# Patient Record
Sex: Female | Born: 1950 | Race: White | Hispanic: No | Marital: Married | Smoking: Former smoker
Health system: Southern US, Community
[De-identification: ages and names within clinical notes are randomized; demographics above are authoritative.]

## PROBLEM LIST (undated history)

## (undated) DIAGNOSIS — I10 Essential (primary) hypertension: Secondary | ICD-10-CM

## (undated) DIAGNOSIS — G709 Myoneural disorder, unspecified: Secondary | ICD-10-CM

## (undated) DIAGNOSIS — R209 Unspecified disturbances of skin sensation: Principal | ICD-10-CM

## (undated) HISTORY — DX: Myoneural disorder, unspecified: G70.9

## (undated) HISTORY — DX: Unspecified disturbances of skin sensation: R20.9

## (undated) HISTORY — PX: TONSILLECTOMY: SUR1361

## (undated) HISTORY — DX: Essential (primary) hypertension: I10

---

## 2007-05-10 HISTORY — PX: CATARACT EXTRACTION: SUR2

## 2012-07-26 ENCOUNTER — Ambulatory Visit (INDEPENDENT_AMBULATORY_CARE_PROVIDER_SITE_OTHER): Payer: BC Managed Care – PPO | Admitting: Family Medicine

## 2012-07-26 ENCOUNTER — Encounter: Payer: Self-pay | Admitting: Family Medicine

## 2012-07-26 VITALS — BP 130/86 | HR 96 | Temp 98.0°F | Resp 16 | Wt 191.0 lb

## 2012-07-26 DIAGNOSIS — M545 Low back pain: Secondary | ICD-10-CM

## 2012-07-26 DIAGNOSIS — I10 Essential (primary) hypertension: Secondary | ICD-10-CM | POA: Insufficient documentation

## 2012-07-26 LAB — URINALYSIS, MICROSCOPIC ONLY: Crystals: NONE SEEN

## 2012-07-26 LAB — URINALYSIS, ROUTINE W REFLEX MICROSCOPIC
Glucose, UA: NEGATIVE mg/dL
Ketones, ur: 15 mg/dL — AB
pH: 6.5 (ref 5.0–8.0)

## 2012-07-26 MED ORDER — CYCLOBENZAPRINE HCL 10 MG PO TABS: 10.0000 mg | ORAL_TABLET | Freq: Three times a day (TID) | ORAL | Status: DC | PRN

## 2012-07-26 NOTE — Addendum Note (Signed)
Addended by: Lynnea Ferrier T on: 07/26/2012 11:00 AM   Modules accepted: Orders

## 2012-07-26 NOTE — Progress Notes (Signed)
Subjective:     Patient ID: Brenda Mahoney, female   DOB: 1950-08-17, 62 y.o.   MRN: [id number]  HPI Patient reports onset of right-sided back pain beginning Saturday.  Was up in her ribs in the costovertebral area.  Has since spread from that area down her entire right sided back to her buttock area.  The pain comes and goes and spells.  He feels more like a muscle per her report.  She denies any dysesthesias or paresthesias radiating into her legs.  Denies any cough shortness of breath or pleurisy.  She denies any fever.  She denies any hematuria or radiation of the pain to her pelvic area.  Review of Systems  Constitutional: Negative.   HENT: Negative.   Eyes: Negative.   Respiratory: Negative.   Cardiovascular: Negative.   Gastrointestinal: Negative.   Genitourinary: Negative.   Musculoskeletal: Positive for back pain. Negative for arthralgias.       Objective:   Physical Exam  Constitutional: She appears well-developed and well-nourished.  HENT:  Head: Normocephalic and atraumatic.  Neck: Normal range of motion. Neck supple.  Cardiovascular: Normal rate, regular rhythm and normal heart sounds.   Pulmonary/Chest: Effort normal and breath sounds normal. No respiratory distress. She has no wheezes. She has no rales. She exhibits no tenderness.  Abdominal: Soft. Bowel sounds are normal. She exhibits no distension. There is no tenderness. There is no rebound and no guarding.  Musculoskeletal:       Thoracic back: She exhibits tenderness and pain. She exhibits normal range of motion, no bony tenderness, no edema, no deformity and no spasm.       Back:  Neurological: She has normal reflexes. She exhibits normal muscle tone. Coordination normal.   pain is an area diagram.  Negative straight leg raise bilaterally     Assessment:     Low back pain Hypertension well controlled    Plan:     Likely a muscle pull.  I will check a urinalysis to rule out hematuria which would suggest a  kidney stone.  If urinalysis is normal I will proceed with NSAIDs, muscle relaxers, and rest.  Recommended heat be applied to the area.

## 2012-07-30 MED ORDER — CIPROFLOXACIN HCL 500 MG PO TABS: 500.0000 mg | ORAL_TABLET | Freq: Two times a day (BID) | ORAL | Status: DC

## 2012-07-30 NOTE — Addendum Note (Signed)
Addended by: Lynnea Ferrier T on: 07/30/2012 07:04 AM   Modules accepted: Orders

## 2012-07-31 ENCOUNTER — Encounter: Payer: Self-pay | Admitting: Family Medicine

## 2012-07-31 LAB — URINE CULTURE

## 2012-07-31 NOTE — Progress Notes (Signed)
Pt aware.

## 2012-08-02 ENCOUNTER — Ambulatory Visit (INDEPENDENT_AMBULATORY_CARE_PROVIDER_SITE_OTHER): Payer: BC Managed Care – PPO | Admitting: Family Medicine

## 2012-08-02 ENCOUNTER — Encounter: Payer: Self-pay | Admitting: Family Medicine

## 2012-08-02 VITALS — BP 144/94 | HR 78 | Temp 98.6°F | Resp 16

## 2012-08-02 DIAGNOSIS — G629 Polyneuropathy, unspecified: Secondary | ICD-10-CM

## 2012-08-02 DIAGNOSIS — G609 Hereditary and idiopathic neuropathy, unspecified: Secondary | ICD-10-CM

## 2012-08-02 LAB — BASIC METABOLIC PANEL
BUN: 18 mg/dL (ref 6–23)
Calcium: 9.9 mg/dL (ref 8.4–10.5)
Glucose, Bld: 90 mg/dL (ref 70–99)

## 2012-08-02 LAB — VITAMIN B12: Vitamin B-12: 437 pg/mL (ref 211–911)

## 2012-08-02 LAB — HEPATIC FUNCTION PANEL
Albumin: 4.6 g/dL (ref 3.5–5.2)
Total Bilirubin: 0.7 mg/dL (ref 0.3–1.2)
Total Protein: 7.1 g/dL (ref 6.0–8.3)

## 2012-08-02 NOTE — Progress Notes (Signed)
Subjective:     Patient ID: Brenda Mahoney, female   DOB: Dec 14, 1950, 62 y.o.   MRN: [id number]  Back Pain   Patient presents with 5 days of numbness and tingling in the tip of her tongue, the tips of her fingers, and the tips of toes.  Otherwise she is doing well. She denies any muscle weakness, paralysis, unilateral neurologic deficits, fevers, headaches, altered mental status.  She denies any vision changes, diplopia, or difficulty swallowing.  She was recently treated for low back pain UTI with Flexeril and Cipro. The symptoms have totally subsided. Ironically the numbness and paresthesias did not begin until she began Flexeril.  I recently checked a TSH, CMP, fasting lipid panel at her physical. Past Medical History  Diagnosis Date  . Hypertension    Current Outpatient Prescriptions on File Prior to Visit  Medication Sig Dispense Refill  . aspirin 81 MG tablet Take 81 mg by mouth daily.      . ciprofloxacin (CIPRO) 500 MG tablet Take 1 tablet (500 mg total) by mouth 2 (two) times daily.  14 tablet  0  . cyclobenzaprine (FLEXERIL) 10 MG tablet Take 1 tablet (10 mg total) by mouth 3 (three) times daily as needed for muscle spasms.  30 tablet  0  . hydrochlorothiazide (HYDRODIURIL) 25 MG tablet Take 25 mg by mouth daily.      . phentermine (ADIPEX-P) 37.5 MG tablet Take 37.5 mg by mouth daily before breakfast.       No current facility-administered medications on file prior to visit.     Review of Systems  Constitutional: Negative.   HENT: Negative.   Eyes: Negative.   Respiratory: Negative.   Cardiovascular: Negative.   Gastrointestinal: Negative.   Endocrine: Negative.   Genitourinary: Negative.   Musculoskeletal: Positive for back pain.       Objective:   Physical Exam  Constitutional: She is oriented to person, place, and time. She appears well-developed and well-nourished. No distress.  HENT:  Head: Normocephalic and atraumatic.  Right Ear: External ear normal.  Left  Ear: External ear normal.  Mouth/Throat: Oropharynx is clear and moist.  Eyes: Conjunctivae are normal. Pupils are equal, round, and reactive to light.  Neck: Normal range of motion. Neck supple. No JVD present. No thyromegaly present.  Cardiovascular: Normal rate, regular rhythm and normal heart sounds.   No murmur heard. Pulmonary/Chest: Effort normal and breath sounds normal.  Abdominal: Soft. Bowel sounds are normal.  Musculoskeletal: Normal range of motion. She exhibits no edema and no tenderness.  Lymphadenopathy:    She has no cervical adenopathy.  Neurological: She is alert and oriented to person, place, and time. She has normal reflexes. No cranial nerve deficit. She exhibits normal muscle tone. Coordination normal.  Skin: Skin is warm and dry. No rash noted. She is not diaphoretic.       Assessment:     Peripheral neuropathy - Plan: Basic Metabolic Panel, Hepatic Function Panel, Vitamin B12      Plan:     Above laboratory studies showed no electrolyte disturbance or low B12, I will ask her to discontinue all medications for one week. If the paresthesias and numbness persist, further workup with nerve conduction studies/EMG.

## 2012-08-08 ENCOUNTER — Telehealth: Payer: Self-pay | Admitting: Family Medicine

## 2012-08-08 DIAGNOSIS — B3749 Other urogenital candidiasis: Secondary | ICD-10-CM

## 2012-08-08 NOTE — Telephone Encounter (Signed)
Due to limited appt space pt would like to drop off urine on Monday to see if infection has cleared (I told her that would be fine) but tingling has not resolved as of today. If tingling does not resolve by Monday pt will schedule and appt.

## 2012-08-08 NOTE — Telephone Encounter (Signed)
If back pain has resolved, she does not need to come back in.  Has her tingling resolved?  If so go back on BP meds

## 2012-08-09 NOTE — Telephone Encounter (Signed)
Ok, drop off urine Monday, resume bp pills on Monday as it does not seem to be the cause of the tingling.

## 2012-08-09 NOTE — Telephone Encounter (Signed)
Order in for ua and will inform pt on Monday to restart BP meds

## 2012-08-09 NOTE — Telephone Encounter (Signed)
Ok, 

## 2012-08-13 ENCOUNTER — Other Ambulatory Visit (INDEPENDENT_AMBULATORY_CARE_PROVIDER_SITE_OTHER): Payer: BC Managed Care – PPO

## 2012-08-13 DIAGNOSIS — B3749 Other urogenital candidiasis: Secondary | ICD-10-CM

## 2012-08-13 DIAGNOSIS — N390 Urinary tract infection, site not specified: Secondary | ICD-10-CM

## 2012-08-13 LAB — URINALYSIS, ROUTINE W REFLEX MICROSCOPIC
Nitrite: NEGATIVE
Protein, ur: NEGATIVE mg/dL

## 2012-08-13 LAB — URINALYSIS, MICROSCOPIC ONLY

## 2012-08-14 ENCOUNTER — Telehealth: Payer: Self-pay | Admitting: Family Medicine

## 2012-08-14 ENCOUNTER — Ambulatory Visit (INDEPENDENT_AMBULATORY_CARE_PROVIDER_SITE_OTHER): Payer: BC Managed Care – PPO | Admitting: Family Medicine

## 2012-08-14 ENCOUNTER — Encounter: Payer: Self-pay | Admitting: Family Medicine

## 2012-08-14 ENCOUNTER — Other Ambulatory Visit: Payer: Self-pay | Admitting: Family Medicine

## 2012-08-14 VITALS — BP 152/110 | HR 96 | Temp 98.3°F | Resp 18 | Ht 65.0 in | Wt 179.0 lb

## 2012-08-14 DIAGNOSIS — G609 Hereditary and idiopathic neuropathy, unspecified: Secondary | ICD-10-CM

## 2012-08-14 DIAGNOSIS — G629 Polyneuropathy, unspecified: Secondary | ICD-10-CM

## 2012-08-14 NOTE — Progress Notes (Signed)
Subjective:     Patient ID: Brenda Mahoney, female   DOB: Mar 21, 1951, 62 y.o.   MRN: [id number]  HPI  Patient continues to complain of paresthesias and numbness in the tips of her fingers and toes. She denies any dyskinesias. She denies any other focal neurologic deficits. Workup to date has included a B12 level of 437, normal fasting blood sugar of 92, and a normal TSH. She has no evidence of uremia with normal kidney studies.  She has no history of chemotherapy exposure or family history of vasculitis. Her cancer screening is up to date with a normal colonoscopy in 2010 and a mammogram that was done in January and normal.  Remainder of her review of systems is negative.  She is very scared and they as evidenced by her elevated blood pressure due to worries over what this can be. She is afraid of multiple sclerosis.  Past Medical History  Diagnosis Date  . Hypertension    Current Outpatient Prescriptions on File Prior to Visit  Medication Sig Dispense Refill  . aspirin 81 MG tablet Take 81 mg by mouth daily.      . hydrochlorothiazide (HYDRODIURIL) 25 MG tablet Take 25 mg by mouth daily.      . ciprofloxacin (CIPRO) 500 MG tablet Take 1 tablet (500 mg total) by mouth 2 (two) times daily.  14 tablet  0  . cyclobenzaprine (FLEXERIL) 10 MG tablet Take 1 tablet (10 mg total) by mouth 3 (three) times daily as needed for muscle spasms.  30 tablet  0  . phentermine (ADIPEX-P) 37.5 MG tablet Take 37.5 mg by mouth daily before breakfast.       No current facility-administered medications on file prior to visit.   History   Social History  . Marital Status: Married    Spouse Name: N/A    Number of Children: N/A  . Years of Education: N/A   Occupational History  . Not on file.   Social History Main Topics  . Smoking status: Former Smoker    Types: Cigarettes  . Smokeless tobacco: Not on file  . Alcohol Use: Not on file  . Drug Use: Not on file  . Sexually Active: Not on file   Other Topics  Concern  . Not on file   Social History Narrative  . No narrative on file     Review of Systems    negative except that dictated in the history of present illness Objective:   Physical Exam  Constitutional: She is oriented to person, place, and time. She appears well-developed and well-nourished.  Neck: Normal range of motion. No thyromegaly present.  Cardiovascular: Normal rate, regular rhythm and normal heart sounds.   Pulmonary/Chest: Effort normal and breath sounds normal. No respiratory distress. She has no wheezes. She has no rales. She exhibits no tenderness.  Abdominal: Soft. Bowel sounds are normal. She exhibits no distension and no mass. There is no tenderness. There is no rebound and no guarding.  Musculoskeletal: Normal range of motion.  Neurological: She is alert and oriented to person, place, and time. She has normal reflexes. She displays normal reflexes. No cranial nerve deficit. She exhibits normal muscle tone. Coordination normal.       Assessment:     1. Unspecified hereditary and idiopathic peripheral neuropathy - Protein electrophoresis, serum; Future - Protein Electrophoresis, Urine Rflx.; Future - Lyme disease dna by pcr(borrelia burg) - ANCA Screen Reflex Titer - ANA - Sedimentation Rate  2. Axonal polyneuropathy - Protein electrophoresis,  serum; Future - Protein Electrophoresis, Urine Rflx.; Future - Lyme disease dna by pcr(borrelia burg) - ANCA Screen Reflex Titer - ANA - Sedimentation Rate  Plan:     Will workup for paraneoplastic syndromes as well as amyloidosis by obtaining an SPEP and UPEP with reflex immunofixation.  We'll rule out vasculitis is by obtaining a sedimentation rate, ANA, ANCA.  Given the fact she recently moved from [place] we will also check Lyme titers.  She is not concerned about possible exposure to syphilis or HIV as she is in a stable long-term monogamous relationship.  Pending the results of these lab studies we'll then  consult neurology for nerve conduction studies/EMG.

## 2012-08-14 NOTE — Telephone Encounter (Signed)
Has been off meds for one week still has tingling in feet.  Strange sensation in fingertips.  Very concerned.  Finished antibiotic also.  Had tingling in both feet and fingers.  Now just in feet with "strange" sensation in fingers??  Please advise.  No open appts for several weeks.  Only taking BP meds.

## 2012-08-14 NOTE — Telephone Encounter (Signed)
Put her in today at 4:15

## 2012-08-15 LAB — ANTI-NUCLEAR AB-TITER (ANA TITER): ANA Titer 1: NEGATIVE

## 2012-08-15 LAB — URINE CULTURE: Colony Count: 25000

## 2012-08-15 LAB — ANA: Anti Nuclear Antibody(ANA): POSITIVE — AB

## 2012-08-16 ENCOUNTER — Other Ambulatory Visit (INDEPENDENT_AMBULATORY_CARE_PROVIDER_SITE_OTHER): Payer: BC Managed Care – PPO

## 2012-08-16 DIAGNOSIS — G609 Hereditary and idiopathic neuropathy, unspecified: Secondary | ICD-10-CM

## 2012-08-16 DIAGNOSIS — G629 Polyneuropathy, unspecified: Secondary | ICD-10-CM

## 2012-08-16 DIAGNOSIS — Z0000 Encounter for general adult medical examination without abnormal findings: Secondary | ICD-10-CM

## 2012-08-16 LAB — LYME DISEASE DNA BY PCR(BORRELIA BURG): B burgdorferi DNA: NOT DETECTED

## 2012-08-17 LAB — PROTEIN ELECTROPHORESIS, URINE REFLEX: Total Protein, Urine: <3 mg/dL

## 2012-08-20 ENCOUNTER — Other Ambulatory Visit: Payer: Self-pay | Admitting: Family Medicine

## 2012-08-20 DIAGNOSIS — G609 Hereditary and idiopathic neuropathy, unspecified: Secondary | ICD-10-CM

## 2012-08-20 LAB — PROTEIN ELECTROPHORESIS, SERUM
Beta 2: 3.1 % — ABNORMAL LOW (ref 3.2–6.5)
Gamma Globulin: 13.6 % (ref 11.1–18.8)

## 2012-08-20 NOTE — Progress Notes (Signed)
Pt aware of results and will proceed with neuro

## 2012-08-31 ENCOUNTER — Ambulatory Visit: Payer: BC Managed Care – PPO | Admitting: Diagnostic Neuroimaging

## 2012-09-03 ENCOUNTER — Encounter: Payer: Self-pay | Admitting: Neurology

## 2012-09-03 ENCOUNTER — Ambulatory Visit (INDEPENDENT_AMBULATORY_CARE_PROVIDER_SITE_OTHER): Payer: BC Managed Care – PPO | Admitting: Neurology

## 2012-09-03 VITALS — BP 144/86 | HR 81 | Ht 66.5 in | Wt 178.0 lb

## 2012-09-03 DIAGNOSIS — R209 Unspecified disturbances of skin sensation: Secondary | ICD-10-CM

## 2012-09-03 HISTORY — DX: Unspecified disturbances of skin sensation: R20.9

## 2012-09-03 NOTE — Progress Notes (Signed)
Reason for visit: Numbness  Brenda Mahoney is a 62 y.o. female  History of present illness:  Brenda Mahoney is a 62 year old right-handed white female with a history of onset of sensory alterations that began around 07/26/2012. The patient indicates that she had onset of tingling and numb sensations that involved the distal foot, and the fingers and the tongue all at the same time. The patient had a relatively rapid onset of symptoms, and since that time, the symptoms have actually improved gradually. The patient reported no weakness, or gait instability. The patient had no speech alteration or problems with swallowing. The patient denies problems controlling the bowels or the bladder. The patient indicates that she had some mid back pain prior to onset of her sensory symptoms, but she denies any other flu like or viral symptoms. The patient was treated for a urinary tract infection with Cipro. The patient has come off of the Cipro and the Flexeril as she was concerned that these medications were causing her symptoms. The patient had an extensive blood work evaluation that revealed a negative Lyme antibody panel, and a positive ANA. B12 levels were normal. The patient denies any low back pain or neck pain, or pain radiating down the arms or legs. The patient is sent to this office for an evaluation.  Past Medical History  Diagnosis Date  . Hypertension   . Disturbance of skin sensation 09/03/2012    Past Surgical History  Procedure Laterality Date  . Cataract extraction Bilateral 2009  . Tonsillectomy      Family History  Problem Relation Age of Onset  . Hypertension Father   . Alzheimer's disease Paternal Grandmother     Social history:  reports that she has quit smoking. Her smoking use included Cigarettes. She smoked 0.00 packs per day. She does not have any smokeless tobacco history on file. She reports that  drinks alcohol. She reports that she does not use illicit drugs.  Medications:   Current Outpatient Prescriptions on File Prior to Visit  Medication Sig Dispense Refill  . aspirin 81 MG tablet Take 81 mg by mouth daily.      . hydrochlorothiazide (HYDRODIURIL) 25 MG tablet Take 25 mg by mouth daily.      . cyclobenzaprine (FLEXERIL) 10 MG tablet Take 1 tablet (10 mg total) by mouth 3 (three) times daily as needed for muscle spasms.  30 tablet  0  . phentermine (ADIPEX-P) 37.5 MG tablet Take 37.5 mg by mouth daily before breakfast.       No current facility-administered medications on file prior to visit.    Allergies:  Allergies  Allergen Reactions  . Penicillins Hives    ROS:  Out of a complete 14 system review of symptoms, the patient complains only of the following symptoms, and all other reviewed systems are negative.  Numbness  Blood pressure 144/86, pulse 81, height 5' 6.5" (1.689 m), weight 178 lb (80.74 kg).  Physical Exam  General: The patient is alert and cooperative at the time of the examination.  Head: Pupils are equal, round, and reactive to light. Discs are flat bilaterally.  Neck: The neck is supple, no carotid bruits are noted.  Respiratory: The respiratory examination is clear.  Cardiovascular: The cardiovascular examination reveals a regular rate and rhythm, no obvious murmurs or rubs are noted.  Neuromuscular: Range of movement of the cervical spine is full and normal.  Skin: Extremities are without significant edema.  Neurologic Exam  Mental status:  Cranial nerves:  Facial symmetry is present. There is good sensation of the face to pinprick and soft touch bilaterally. The strength of the facial muscles and the muscles to head turning and shoulder shrug are normal bilaterally. Speech is well enunciated, no aphasia or dysarthria is noted. Extraocular movements are full. Visual fields are full.  Motor: The motor testing reveals 5 over 5 strength of all 4 extremities. Good symmetric motor tone is noted throughout.  Sensory:  Sensory testing is intact to pinprick, soft touch, vibration sensation, and position sense on all 4 extremities. No evidence of extinction is noted.  Coordination: Cerebellar testing reveals good finger-nose-finger and heel-to-shin bilaterally.  Gait and station: Gait is normal. Tandem gait is normal. Romberg is negative. No drift is seen.  Reflexes: Deep tendon reflexes are symmetric and normal bilaterally, with the exception that the ankle jerk reflexes are depressed. Toes are downgoing bilaterally.   Assessment/Plan:  1. Numbness  The patient reports onset of sensory alterations on all 4 extremities and on the tongue at the same time. This history would be unusual for a neuropathy, but entities such as Guillain-Barre syndrome with mild symptoms need to be considered. The workup however, should include MRI of the brain initially, with further blood work. If the studies are unremarkable, a nerve conduction study may be done. The jerk reflexes are depressed. The patient will followup if needed. I will contact her with the results of the above studies. The patient will need evaluation for a possible demyelinating disease.  Marlan Palau MD 09/03/2012 2:21 PM  [facility] [address] [place], [place] [address]  Phone [phone] Fax [phone]

## 2012-09-04 ENCOUNTER — Ambulatory Visit: Payer: BC Managed Care – PPO | Admitting: Family Medicine

## 2012-09-05 ENCOUNTER — Telehealth: Payer: Self-pay | Admitting: Neurology

## 2012-09-05 LAB — ENA+DNA/DS+SJORGEN'S
ENA RNP Ab: <0.2 AI (ref 0.0–0.9)
ENA SSA (RO) Ab: <0.2 AI (ref 0.0–0.9)
ENA Smith (SM) Ab: <0.2 AI (ref 0.0–0.9)

## 2012-09-05 NOTE — Telephone Encounter (Signed)
I called the patient. The blood work was relatively unremarkable, but ANA was positive. The reflex studies were negative however, and the clinical significance of the positive ANA is doubtful. MRI the brain is pending, I'll contact the patient once this is done.

## 2012-09-06 ENCOUNTER — Ambulatory Visit (INDEPENDENT_AMBULATORY_CARE_PROVIDER_SITE_OTHER): Payer: BC Managed Care – PPO

## 2012-09-06 DIAGNOSIS — R209 Unspecified disturbances of skin sensation: Secondary | ICD-10-CM

## 2012-09-07 ENCOUNTER — Telehealth: Payer: Self-pay | Admitting: Neurology

## 2012-09-07 ENCOUNTER — Encounter: Payer: Self-pay | Admitting: Family Medicine

## 2012-09-07 ENCOUNTER — Ambulatory Visit (INDEPENDENT_AMBULATORY_CARE_PROVIDER_SITE_OTHER): Payer: BC Managed Care – PPO | Admitting: Family Medicine

## 2012-09-07 VITALS — BP 150/98 | HR 74 | Temp 98.4°F | Resp 16 | Wt 180.0 lb

## 2012-09-07 DIAGNOSIS — I10 Essential (primary) hypertension: Secondary | ICD-10-CM

## 2012-09-07 DIAGNOSIS — K649 Unspecified hemorrhoids: Secondary | ICD-10-CM

## 2012-09-07 MED ORDER — HYDROCORTISONE ACETATE 25 MG RE SUPP: 25.0000 mg | Freq: Two times a day (BID) | RECTAL | Status: DC

## 2012-09-07 MED ORDER — LOSARTAN POTASSIUM 50 MG PO TABS: 50.0000 mg | ORAL_TABLET | Freq: Every day | ORAL | Status: DC

## 2012-09-07 MED ORDER — GADOPENTETATE DIMEGLUMINE 469.01 MG/ML IV SOLN: 16.0000 mL | Freq: Once | INTRAVENOUS | Status: AC | PRN

## 2012-09-07 NOTE — Telephone Encounter (Signed)
I called patient. The MRI the brain was unremarkable. I discussed the potential he getting a nerve conduction study. The patient indicates that the tingling in the hands are getting better. Did to the cost reasons, the patient does not wish to undergo the nerve conduction study at this time. She will contact me if she believes that the symptoms are getting worse.

## 2012-09-07 NOTE — Progress Notes (Signed)
  Subjective:    Patient ID: Brenda Mahoney, female    DOB: 1950-07-10, 62 y.o.   MRN: [id number]  HPI Patient presents with 2 concerns. Prominent one is her blood pressure. She is currently taking adequate Dyazide 25 mg by mouth daily. She denies chest pain or shortness of breath. Her blood pressure remains elevated at 150/98  #2 is hemorrhoids-  she reports hemorrhoids which are inflamed around her rectum. She describes some trace bright red blood per rectum with wiping. She describes itching and pain with defecation.   Past Medical History  Diagnosis Date  . Hypertension   . Disturbance of skin sensation 09/03/2012  . Neuromuscular disorder    Current Outpatient Prescriptions on File Prior to Visit  Medication Sig Dispense Refill  . aspirin 81 MG tablet Take 81 mg by mouth daily.      . hydrochlorothiazide (HYDRODIURIL) 25 MG tablet Take 25 mg by mouth daily.       Current Facility-Administered Medications on File Prior to Visit  Medication Dose Route Frequency Provider Last Rate Last Dose  . gadopentetate dimeglumine (MAGNEVIST) injection 16 mL  16 mL Intravenous Once PRN York Spaniel, MD       Allergies  Allergen Reactions  . Penicillins Hives   History   Social History  . Marital Status: Married    Spouse Name: N/A    Number of Children: 1  . Years of Education: MS   Occupational History  .     Social History Main Topics  . Smoking status: Former Smoker    Types: Cigarettes  . Smokeless tobacco: Not on file  . Alcohol Use: Yes     Comment: One glass of wine per day  . Drug Use: No  . Sexually Active: Not on file   Other Topics Concern  . Not on file   Social History Narrative  . No narrative on file     Review of Systems  All other systems reviewed and are negative.       Objective:   Physical Exam  Vitals reviewed. Constitutional: She appears well-developed and well-nourished.  Cardiovascular: Normal rate, regular rhythm, normal heart sounds and  intact distal pulses.   No murmur heard. Pulmonary/Chest: Effort normal and breath sounds normal. No respiratory distress. She has no wheezes. She has no rales.   rectal exam shows a protruding internal hemorrhoid at 3:00 with the patient leaning over the exam table. There is no thrombus. There is no perirectal abscess.          Assessment & Plan:  Essential hypertension, benign  Hemorrhoids  We will add losartan 50 mg by mouth daily for her blood pressure. Recheck blood pressure in one month. Continue hydrochlorothiazide. For her hemorrhoids, I gave her Anusol-HC suppositories 1 per rectum twice a day for 7 days. She is to return immediately if symptoms worsen or change. She is return is symptoms persist. We also discussed possible surgical options for persistent hemorrhoids.

## 2013-03-15 ENCOUNTER — Encounter: Payer: Self-pay | Admitting: Family Medicine

## 2013-03-15 ENCOUNTER — Telehealth: Payer: Self-pay | Admitting: *Deleted

## 2013-03-15 ENCOUNTER — Ambulatory Visit (INDEPENDENT_AMBULATORY_CARE_PROVIDER_SITE_OTHER): Payer: BC Managed Care – PPO | Admitting: Family Medicine

## 2013-03-15 VITALS — BP 130/82 | HR 80 | Temp 98.2°F | Resp 16 | Wt 180.0 lb

## 2013-03-15 DIAGNOSIS — L719 Rosacea, unspecified: Secondary | ICD-10-CM

## 2013-03-15 MED ORDER — METRONIDAZOLE 1 % EX GEL: Freq: Every day | CUTANEOUS | Status: DC

## 2013-03-15 MED ORDER — DOXYCYCLINE HYCLATE 100 MG PO CAPS: 100.0000 mg | ORAL_CAPSULE | Freq: Every day | ORAL | Status: DC

## 2013-03-15 NOTE — Telephone Encounter (Signed)
Can we switch her to doxycycline 100 mg poqday.  Unfortunately it is oral.

## 2013-03-15 NOTE — Telephone Encounter (Signed)
Pt ok with doxycycline and rx sent to pharm

## 2013-03-15 NOTE — Progress Notes (Signed)
  Subjective:    Patient ID: Brenda Mahoney, female    DOB: 1951-04-23, 62 y.o.   MRN: [id number]  HPI  Patient is a very pleasant 62 year old white female who comes in today complaining of rosacea. She has erythematous rash on both cheeks with occasional red papules. It also encompasses her for head. She has dealt with rosacea off and on for years. However today the rash has gradually worsened to the point she would like to consider treatment. Otherwise she is doing well. She does report some mild hair loss in an androgenic pattern. Extending in the center. The sides and back continue remainder conformal. She has no areas of alopecia areata. . She is not under increased stress. Past Medical History  Diagnosis Date  . Hypertension   . Disturbance of skin sensation 09/03/2012  . Neuromuscular disorder    Current Outpatient Prescriptions on File Prior to Visit  Medication Sig Dispense Refill  . aspirin 81 MG tablet Take 81 mg by mouth daily.      . hydrochlorothiazide (HYDRODIURIL) 25 MG tablet Take 25 mg by mouth daily.       No current facility-administered medications on file prior to visit.   Allergies  Allergen Reactions  . Penicillins Hives   History   Social History  . Marital Status: Married    Spouse Name: N/A    Number of Children: 1  . Years of Education: MS   Occupational History  .     Social History Main Topics  . Smoking status: Former Smoker    Types: Cigarettes  . Smokeless tobacco: Not on file  . Alcohol Use: Yes     Comment: One glass of wine per day  . Drug Use: No  . Sexual Activity: Not on file   Other Topics Concern  . Not on file   Social History Narrative  . No narrative on file     Review of Systems  All other systems reviewed and are negative.       Objective:   Physical Exam  Vitals reviewed. Cardiovascular: Normal rate, regular rhythm and normal heart sounds.   Pulmonary/Chest: Effort normal and breath sounds normal.  Skin: Rash  noted. There is erythema.    Patient has generalized erythema in both cheeks with occasional red papules. It is also present on her forehead.      Assessment & Plan:  1. Rosacea Begin MetroGel 1% apply daily. Recheck in 4-5 weeks. His symptoms have not improved consider doxycycline. Androgenic alopecia I recommended Rogaine over-the-counter. If that does not work the patient could consider finasteride off label. - metroNIDAZOLE (METROGEL) 1 % gel; Apply topically daily.  Dispense: 60 g; Refill: 5

## 2013-07-16 ENCOUNTER — Ambulatory Visit (INDEPENDENT_AMBULATORY_CARE_PROVIDER_SITE_OTHER): Payer: BC Managed Care – PPO | Admitting: Family Medicine

## 2013-07-16 ENCOUNTER — Encounter: Payer: Self-pay | Admitting: Family Medicine

## 2013-07-16 ENCOUNTER — Other Ambulatory Visit: Payer: Self-pay | Admitting: Family Medicine

## 2013-07-16 ENCOUNTER — Encounter: Payer: Self-pay | Admitting: *Deleted

## 2013-07-16 VITALS — BP 128/80 | HR 80 | Temp 99.0°F | Resp 16 | Ht 66.5 in | Wt 180.0 lb

## 2013-07-16 DIAGNOSIS — Z1231 Encounter for screening mammogram for malignant neoplasm of breast: Secondary | ICD-10-CM

## 2013-07-16 DIAGNOSIS — I10 Essential (primary) hypertension: Secondary | ICD-10-CM

## 2013-07-16 MED ORDER — HYDROCHLOROTHIAZIDE 25 MG PO TABS: 25.0000 mg | ORAL_TABLET | Freq: Every day | ORAL | Status: DC

## 2013-07-16 NOTE — Progress Notes (Signed)
   Subjective:    Patient ID: Brenda Mahoney, female    DOB: 07-07-1950, 63 y.o.   MRN: [id number]  HPI Patient is a very pleasant 27 white female who comes in today for recheck of her blood pressure. He is currently 128/80. She is taking hydrochlorothiazide 25 mg by mouth daily. She denies any chest pain, shortness of breath, dyspnea on exertion. She denies any cramps or myalgias. Symptoms of peripheral neuropathy that she had last year have improved. Overall she is doing well. I have recommended an annual mammogram. I have also recommended a Pap smear every 3 years. The patient defers these for now. Past Medical History  Diagnosis Date  . Hypertension   . Disturbance of skin sensation 09/03/2012  . Neuromuscular disorder    Current Outpatient Prescriptions on File Prior to Visit  Medication Sig Dispense Refill  . aspirin 81 MG tablet Take 81 mg by mouth daily.       No current facility-administered medications on file prior to visit.   Allergies  Allergen Reactions  . Penicillins Hives   History   Social History  . Marital Status: Married    Spouse Name: N/A    Number of Children: 1  . Years of Education: MS   Occupational History  .     Social History Main Topics  . Smoking status: Former Smoker    Types: Cigarettes  . Smokeless tobacco: Not on file  . Alcohol Use: Yes     Comment: One glass of wine per day  . Drug Use: No  . Sexual Activity: Not on file   Other Topics Concern  . Not on file   Social History Narrative  . No narrative on file      Review of Systems  All other systems reviewed and are negative.       Objective:   Physical Exam  Vitals reviewed. Constitutional: She appears well-developed and well-nourished.  Neck: Neck supple. No thyromegaly present.  Cardiovascular: Normal rate, regular rhythm and normal heart sounds.  Exam reveals no gallop and no friction rub.   No murmur heard. Pulmonary/Chest: Effort normal and breath sounds normal. No  respiratory distress. She has no wheezes. She has no rales.  Abdominal: Soft. Bowel sounds are normal. She exhibits no distension. There is no tenderness. There is no rebound and no guarding.  Musculoskeletal: She exhibits no edema.  Lymphadenopathy:    She has no cervical adenopathy.          Assessment & Plan:  1. HTN (hypertension) Patient's blood pressure is excellent. Continue hydrochlorothiazide 25 mg by mouth daily. Have asked the patient return fasting for CBC CMP and fasting lipid panel. I have also recommended an angle mammogram and a Pap smear every 3 years. The patient defers these for now. She is concerned about a lesion on the posterior aspect of her left shoulder. It is an 8 mm pink fleshy papule with a warty appearance. I believe that it is a seborrheic Keratosis. I reassured the patient. If the lesion changes we can certainly do a shave biopsy - COMPLETE METABOLIC PANEL WITH GFR; Future - Lipid panel; Future - CBC with Differential; Future

## 2013-08-15 ENCOUNTER — Encounter: Payer: Self-pay | Admitting: Family Medicine

## 2013-11-14 ENCOUNTER — Ambulatory Visit (INDEPENDENT_AMBULATORY_CARE_PROVIDER_SITE_OTHER): Payer: BC Managed Care – PPO | Admitting: Family Medicine

## 2013-11-14 ENCOUNTER — Encounter: Payer: Self-pay | Admitting: Family Medicine

## 2013-11-14 VITALS — BP 110/88 | HR 100 | Temp 98.5°F | Resp 16 | Ht 66.5 in | Wt 180.0 lb

## 2013-11-14 DIAGNOSIS — M65311 Trigger thumb, right thumb: Secondary | ICD-10-CM

## 2013-11-14 DIAGNOSIS — M6530 Trigger finger, unspecified finger: Secondary | ICD-10-CM

## 2013-11-14 DIAGNOSIS — E669 Obesity, unspecified: Secondary | ICD-10-CM

## 2013-11-14 MED ORDER — PHENTERMINE HCL 37.5 MG PO CAPS: 37.5000 mg | ORAL_CAPSULE | ORAL | Status: DC

## 2013-11-14 NOTE — Progress Notes (Signed)
   Subjective:    Patient ID: Brenda Mahoney, female    DOB: 01-01-1951, 63 y.o.   MRN: [id number]  HPI  Patient complains of pain in the right thumb with flexion. She also complains of a popping and locking sensation at the base of the first MCP joint.  She is also been exercising vigorously the last 3 months. She is running and walking at least an hour a day. She's been unsuccessful in losing weight. She is interested in using adipex to facilitate weight loss.   Past Medical History  Diagnosis Date  . Hypertension   . Disturbance of skin sensation 09/03/2012  . Neuromuscular disorder    Current Outpatient Prescriptions on File Prior to Visit  Medication Sig Dispense Refill  . aspirin 81 MG tablet Take 81 mg by mouth daily.      . hydrochlorothiazide (HYDRODIURIL) 25 MG tablet Take 1 tablet (25 mg total) by mouth daily.  90 tablet  3   No current facility-administered medications on file prior to visit.   Allergies  Allergen Reactions  . Penicillins Hives   History   Social History  . Marital Status: Married    Spouse Name: N/A    Number of Children: 1  . Years of Education: MS   Occupational History  .     Social History Main Topics  . Smoking status: Former Smoker    Types: Cigarettes  . Smokeless tobacco: Not on file  . Alcohol Use: Yes     Comment: One glass of wine per day  . Drug Use: No  . Sexual Activity: Not on file   Other Topics Concern  . Not on file   Social History Narrative  . No narrative on file       Review of Systems  All other systems reviewed and are negative.      Objective:   Physical Exam  Vitals reviewed. Cardiovascular: Normal rate, regular rhythm and normal heart sounds.   Pulmonary/Chest: Effort normal and breath sounds normal.  Abdominal: Soft. Bowel sounds are normal.  Musculoskeletal:       Right hand: She exhibits normal range of motion, no tenderness, no bony tenderness, no deformity, no laceration and no swelling. Normal  sensation noted. Normal strength noted.          Assessment & Plan:  1. Trigger thumb of right hand Suspect the patient is developing a trigger finger. I have recommended a thumb spica splint and relative immobilization and rest for several weeks. If the problem worsens consider cortisone injection.  2. Obesity, unspecified adipex 37.5 mg poqday.  I gave the patient 30 pills with 2 refills.  Discussed possible side effects of the med.

## 2014-02-07 ENCOUNTER — Telehealth: Payer: Self-pay | Admitting: Family Medicine

## 2014-02-07 DIAGNOSIS — M6530 Trigger finger, unspecified finger: Secondary | ICD-10-CM

## 2014-02-07 NOTE — Telephone Encounter (Signed)
Patient is calling to try to get a referral for her trigger finger. It has not gotten any better  [phone]

## 2014-02-07 NOTE — Telephone Encounter (Signed)
LMTRC

## 2014-02-10 NOTE — Telephone Encounter (Signed)
Referral initiated

## 2014-02-10 NOTE — Telephone Encounter (Signed)
Pt called back and stated that she was here in July and had a problem with her finger trigger and states it has gotten worse, wants to a referral to have somethng done.

## 2014-02-10 NOTE — Telephone Encounter (Signed)
Ok with referral to hand/ortho

## 2014-05-19 ENCOUNTER — Encounter: Payer: Self-pay | Admitting: Family Medicine

## 2014-05-19 ENCOUNTER — Ambulatory Visit (INDEPENDENT_AMBULATORY_CARE_PROVIDER_SITE_OTHER): Payer: 59 | Admitting: Family Medicine

## 2014-05-19 VITALS — BP 130/84 | HR 74 | Temp 98.7°F | Resp 14 | Ht 66.5 in | Wt 168.0 lb

## 2014-05-19 DIAGNOSIS — I10 Essential (primary) hypertension: Secondary | ICD-10-CM

## 2014-05-19 DIAGNOSIS — Z13820 Encounter for screening for osteoporosis: Secondary | ICD-10-CM

## 2014-05-19 DIAGNOSIS — Z1231 Encounter for screening mammogram for malignant neoplasm of breast: Secondary | ICD-10-CM

## 2014-05-19 MED ORDER — HYDROCHLOROTHIAZIDE 25 MG PO TABS: 25.0000 mg | ORAL_TABLET | Freq: Every day | ORAL | Status: DC

## 2014-05-19 NOTE — Progress Notes (Signed)
Subjective:    Patient ID: Brenda Mahoney, female    DOB: 13-Jul-1950, 64 y.o.   MRN: [id number]  HPI Patient is here today for follow-up of her hypertension. She is however upset. Her husband is very disappointed in our clinic because he was unable to be seen acutely 1 day for an upper respiratory infection. This has the patient upset as well. Otherwise she has no complaints. She denies any chest pain shortness of breath or dyspnea on exertion. She continues to take hydrochlorthiazide 25 mg by mouth daily for hypertension. She denies any significant leg swelling. She denies any muscle cramps. She has her labs checked annually at work. This is scheduled in one month. At that time they perform a CBC, CMP, and fasting lipid panel. I have asked the patient to bring me the values that I can review them. She is due for mammogram in March. She is due for a bone density test as well. Colonoscopy was approximately 5 years ago. The remainder of her preventative care is up-to-date. Past Medical History  Diagnosis Date  . Hypertension   . Disturbance of skin sensation 09/03/2012  . Neuromuscular disorder    Past Surgical History  Procedure Laterality Date  . Cataract extraction Bilateral 2009  . Tonsillectomy     Current Outpatient Prescriptions on File Prior to Visit  Medication Sig Dispense Refill  . aspirin 81 MG tablet Take 81 mg by mouth daily.     No current facility-administered medications on file prior to visit.   Allergies  Allergen Reactions  . Penicillins Hives   History   Social History  . Marital Status: Married    Spouse Name: N/A    Number of Children: 1  . Years of Education: MS   Occupational History  .     Social History Main Topics  . Smoking status: Former Smoker    Types: Cigarettes  . Smokeless tobacco: Not on file  . Alcohol Use: Yes     Comment: One glass of wine per day  . Drug Use: No  . Sexual Activity: Not on file   Other Topics Concern  . Not on file     Social History Narrative      Review of Systems  All other systems reviewed and are negative.      Objective:   Physical Exam  Constitutional: She appears well-developed and well-nourished.  Neck: Neck supple. No JVD present. No thyromegaly present.  Cardiovascular: Normal rate, regular rhythm, normal heart sounds and intact distal pulses.   No murmur heard. Pulmonary/Chest: Effort normal and breath sounds normal. No respiratory distress. She has no wheezes. She has no rales.  Abdominal: Soft. Bowel sounds are normal. She exhibits no distension. There is no tenderness. There is no rebound and no guarding.  Musculoskeletal: She exhibits no edema.  Vitals reviewed.         Assessment & Plan:  Benign essential HTN  Screening mammogram for high-risk patient - Plan: MM Digital Screening  Screening for osteoporosis - Plan: DG Bone Density  Patient's blood pressures acceptable. I would like to see her CBC, CMP, and fasting lipid panel in one month. I will schedule the patient for mammogram as well as a bone density test in March. The remainder of her preventative care is up-to-date. I recommended a pneumonia vaccine after 65. I apologized to the patient that her husband was upset for not being able to be seen as a walk-in. I was unaware that this happened.  If the patient would like to change medical providers, I will be glad to facilitate him in any way that I can.

## 2014-05-23 ENCOUNTER — Encounter: Payer: Self-pay | Admitting: *Deleted

## 2014-08-05 ENCOUNTER — Ambulatory Visit (INDEPENDENT_AMBULATORY_CARE_PROVIDER_SITE_OTHER): Payer: 59 | Admitting: Family Medicine

## 2014-08-05 ENCOUNTER — Encounter: Payer: Self-pay | Admitting: Family Medicine

## 2014-08-05 VITALS — BP 146/98 | HR 68 | Temp 97.4°F | Resp 18

## 2014-08-05 DIAGNOSIS — I10 Essential (primary) hypertension: Secondary | ICD-10-CM

## 2014-08-05 DIAGNOSIS — J209 Acute bronchitis, unspecified: Secondary | ICD-10-CM | POA: Diagnosis not present

## 2014-08-05 MED ORDER — LOSARTAN POTASSIUM 50 MG PO TABS: 50.0000 mg | ORAL_TABLET | Freq: Every day | ORAL | Status: DC

## 2014-08-05 MED ORDER — AZITHROMYCIN 250 MG PO TABS: ORAL_TABLET | ORAL | Status: DC

## 2014-08-05 NOTE — Progress Notes (Signed)
   Subjective:    Patient ID: Brenda Mahoney, female    DOB: [birth year], 64 y.o.   MRN: [id number]  HPI  Patient has had a cough that is worsening for the last 3 weeks. The cough is productive of yellow sputum. She reports subjective fevers and chills. She also reports fatigue. She reports some mild shortness of breath and dyspnea on exertion. Past Medical History  Diagnosis Date  . Hypertension   . Disturbance of skin sensation 09/03/2012  . Neuromuscular disorder    Past Surgical History  Procedure Laterality Date  . Cataract extraction Bilateral 2009  . Tonsillectomy     Current Outpatient Prescriptions on File Prior to Visit  Medication Sig Dispense Refill  . aspirin 81 MG tablet Take 81 mg by mouth daily.    Marland Kitchen glucosamine-chondroitin 500-400 MG tablet Take 1 tablet by mouth 3 (three) times daily.    . Omega-3 Fatty Acids (FISH OIL) 1200 MG CAPS Take by mouth.    . hydrochlorothiazide (HYDRODIURIL) 25 MG tablet Take 1 tablet (25 mg total) by mouth daily. (Patient not taking: Reported on 08/05/2014) 90 tablet 3   No current facility-administered medications on file prior to visit.   Allergies  Allergen Reactions  . Penicillins Hives   History   Social History  . Marital Status: Married    Spouse Name: N/A  . Number of Children: 1  . Years of Education: MS   Occupational History  .     Social History Main Topics  . Smoking status: Former Smoker    Types: Cigarettes  . Smokeless tobacco: Not on file  . Alcohol Use: Yes     Comment: One glass of wine per day  . Drug Use: No  . Sexual Activity: Not on file   Other Topics Concern  . Not on file   Social History Narrative     Review of Systems  All other systems reviewed and are negative.      Objective:   Physical Exam  Constitutional: She appears well-developed and well-nourished.  HENT:  Right Ear: External ear normal.  Left Ear: External ear normal.  Nose: Nose normal.  Mouth/Throat: Oropharynx is  clear and moist. No oropharyngeal exudate.  Eyes: Conjunctivae are normal.  Neck: Neck supple.  Cardiovascular: Normal rate, regular rhythm, normal heart sounds and intact distal pulses.   Pulmonary/Chest: Effort normal and breath sounds normal. No respiratory distress. She has no wheezes. She has no rales. She exhibits no tenderness.  Lymphadenopathy:    She has no cervical adenopathy.  Vitals reviewed.         Assessment & Plan:  Benign essential HTN - Plan: losartan (COZAAR) 50 MG tablet  Acute bronchitis, unspecified organism  Patient has bronchitis. Begin a Z-Pak. Continue to use Delsym as needed for cough. Patient wants to discontinue hydrochlorothiazide due to constipation. I will replace the hydrochlorothiazide with losartan 50 mg by mouth daily.

## 2014-09-04 LAB — HM DEXA SCAN: HM Dexa Scan: NORMAL

## 2014-09-04 LAB — HM MAMMOGRAPHY: HM Mammogram: NORMAL

## 2014-10-01 ENCOUNTER — Other Ambulatory Visit: Payer: Self-pay | Admitting: Family Medicine

## 2014-10-01 DIAGNOSIS — I10 Essential (primary) hypertension: Secondary | ICD-10-CM

## 2014-10-01 MED ORDER — LOSARTAN POTASSIUM 50 MG PO TABS: 50.0000 mg | ORAL_TABLET | Freq: Every day | ORAL | Status: DC

## 2014-10-01 NOTE — Telephone Encounter (Signed)
Medication called/sent to requested pharmacy  

## 2014-10-02 ENCOUNTER — Encounter: Payer: Self-pay | Admitting: Family Medicine

## 2014-10-10 ENCOUNTER — Encounter: Payer: Self-pay | Admitting: Family Medicine

## 2014-10-10 ENCOUNTER — Ambulatory Visit (INDEPENDENT_AMBULATORY_CARE_PROVIDER_SITE_OTHER): Payer: 59 | Admitting: Family Medicine

## 2014-10-10 VITALS — BP 130/82 | HR 70 | Temp 98.1°F | Resp 16

## 2014-10-10 DIAGNOSIS — R252 Cramp and spasm: Secondary | ICD-10-CM | POA: Diagnosis not present

## 2014-10-10 NOTE — Progress Notes (Signed)
   Subjective:    Patient ID: Brenda Mahoney, female    DOB: [birth year], 64 y.o.   MRN: [id number]  HPI  Patient's mammogram was normal and her DEXA revealed normal bone mass.  She reports occasional muscle cramps in her calves and feet at night.   Past Medical History  Diagnosis Date  . Hypertension   . Disturbance of skin sensation 09/03/2012  . Neuromuscular disorder    Past Surgical History  Procedure Laterality Date  . Cataract extraction Bilateral 2009  . Tonsillectomy     Current Outpatient Prescriptions on File Prior to Visit  Medication Sig Dispense Refill  . aspirin 81 MG tablet Take 81 mg by mouth daily.    Marland Kitchen glucosamine-chondroitin 500-400 MG tablet Take 1 tablet by mouth 3 (three) times daily.    Marland Kitchen losartan (COZAAR) 50 MG tablet Take 1 tablet (50 mg total) by mouth daily. 90 tablet 3  . Omega-3 Fatty Acids (FISH OIL) 1200 MG CAPS Take by mouth.     No current facility-administered medications on file prior to visit.   Allergies  Allergen Reactions  . Penicillins Hives   History   Social History  . Marital Status: Married    Spouse Name: N/A  . Number of Children: 1  . Years of Education: MS   Occupational History  .     Social History Main Topics  . Smoking status: Former Smoker    Types: Cigarettes  . Smokeless tobacco: Not on file  . Alcohol Use: Yes     Comment: One glass of wine per day  . Drug Use: No  . Sexual Activity: Not on file   Other Topics Concern  . Not on file   Social History Narrative     Review of Systems  All other systems reviewed and are negative.      Objective:   Physical Exam  Cardiovascular: Normal rate, regular rhythm and normal heart sounds.   No murmur heard. Pulmonary/Chest: Effort normal and breath sounds normal. No respiratory distress. She has no wheezes. She has no rales.  Abdominal: Soft. Bowel sounds are normal.  Musculoskeletal:       Right knee: Normal.       Left knee: Normal.       Right ankle:  Normal.       Left ankle: Normal.       Right lower leg: Normal.       Left lower leg: Normal.  Vitals reviewed.         Assessment & Plan:  Cramps of lower extremity, unspecified laterality  Try stretchin every night before bed and tonic water.  If no better, check cmp and magnesium level.  Consider muscle relaxers at night if persistent.

## 2015-02-04 ENCOUNTER — Telehealth: Payer: Self-pay | Admitting: Family Medicine

## 2015-02-04 MED ORDER — ALPRAZOLAM 0.5 MG PO TABS: 0.5000 mg | ORAL_TABLET | Freq: Four times a day (QID) | ORAL | Status: AC

## 2015-02-04 NOTE — Telephone Encounter (Signed)
Pt called and states that husband passed away yesterday and would like something called into the pharm for her nerves. Per MBD ok to call in Xanax .5 mg 1 po q 6 hours prn. Med called to pharm and pt aware.

## 2015-07-09 ENCOUNTER — Other Ambulatory Visit: Payer: Self-pay | Admitting: Family Medicine

## 2015-07-09 NOTE — Telephone Encounter (Signed)
Refill appropriate and filled per protocol. 

## 2016-01-01 ENCOUNTER — Other Ambulatory Visit: Payer: Self-pay | Admitting: Family Medicine

## 2016-01-01 NOTE — Telephone Encounter (Signed)
Refill appropriate and filled per protocol. 

## 2016-02-05 DIAGNOSIS — C439 Malignant melanoma of skin, unspecified: Secondary | ICD-10-CM

## 2016-02-05 HISTORY — DX: Malignant melanoma of skin, unspecified: C43.9

## 2016-06-15 DIAGNOSIS — D239 Other benign neoplasm of skin, unspecified: Secondary | ICD-10-CM

## 2016-06-15 HISTORY — DX: Other benign neoplasm of skin, unspecified: D23.9

## 2017-08-02 ENCOUNTER — Other Ambulatory Visit: Payer: Self-pay | Admitting: Family Medicine

## 2017-08-02 DIAGNOSIS — Z1231 Encounter for screening mammogram for malignant neoplasm of breast: Secondary | ICD-10-CM

## 2018-09-26 ENCOUNTER — Encounter: Payer: Self-pay | Admitting: *Deleted

## 2018-12-20 ENCOUNTER — Ambulatory Visit
Admission: RE | Admit: 2018-12-20 | Discharge: 2018-12-20 | Disposition: A | Discharge status: Home / Self Care | Payer: 59 | Source: Ambulatory Visit | Attending: Family Medicine | Admitting: Family Medicine

## 2018-12-20 ENCOUNTER — Other Ambulatory Visit: Payer: Self-pay | Admitting: Family Medicine

## 2018-12-20 DIAGNOSIS — M545 Low back pain, unspecified: Secondary | ICD-10-CM

## 2019-01-02 ENCOUNTER — Other Ambulatory Visit: Payer: Self-pay | Admitting: Family Medicine

## 2019-01-02 ENCOUNTER — Other Ambulatory Visit: Payer: Self-pay

## 2019-01-02 ENCOUNTER — Ambulatory Visit
Admission: RE | Admit: 2019-01-02 | Discharge: 2019-01-02 | Disposition: A | Discharge status: Home / Self Care | Payer: 59 | Source: Ambulatory Visit | Attending: Family Medicine | Admitting: Family Medicine

## 2019-01-02 DIAGNOSIS — N200 Calculus of kidney: Secondary | ICD-10-CM

## 2019-01-10 ENCOUNTER — Other Ambulatory Visit: Payer: Self-pay | Admitting: Family Medicine

## 2019-01-10 DIAGNOSIS — R935 Abnormal findings on diagnostic imaging of other abdominal regions, including retroperitoneum: Secondary | ICD-10-CM

## 2019-01-17 ENCOUNTER — Ambulatory Visit
Admission: RE | Admit: 2019-01-17 | Discharge: 2019-01-17 | Disposition: A | Discharge status: Home / Self Care | Payer: 59 | Source: Ambulatory Visit | Attending: Family Medicine | Admitting: Family Medicine

## 2019-01-17 DIAGNOSIS — R935 Abnormal findings on diagnostic imaging of other abdominal regions, including retroperitoneum: Secondary | ICD-10-CM

## 2019-01-17 MED ORDER — IOPAMIDOL (ISOVUE-300) INJECTION 61%
100.0000 mL | Freq: Once | INTRAVENOUS | Status: AC | PRN
  Administered 2019-01-17: 100 mL via INTRAVENOUS

## 2019-12-06 DIAGNOSIS — H0014 Chalazion left upper eyelid: Secondary | ICD-10-CM | POA: Diagnosis not present

## 2019-12-06 DIAGNOSIS — H1132 Conjunctival hemorrhage, left eye: Secondary | ICD-10-CM | POA: Diagnosis not present

## 2020-02-21 DIAGNOSIS — I10 Essential (primary) hypertension: Secondary | ICD-10-CM | POA: Diagnosis not present

## 2020-02-21 DIAGNOSIS — E7800 Pure hypercholesterolemia, unspecified: Secondary | ICD-10-CM | POA: Diagnosis not present

## 2020-02-21 DIAGNOSIS — Z880 Allergy status to penicillin: Secondary | ICD-10-CM | POA: Diagnosis not present

## 2020-02-21 DIAGNOSIS — Z23 Encounter for immunization: Secondary | ICD-10-CM | POA: Diagnosis not present

## 2020-02-24 IMAGING — CT CT ABD-PELV W/ CM
2 of 7 series · 12 of 46 positions shown, 14 images · IV contrast (iopamidol)
Comparison: 01/02/2019

CLINICAL DATA: Follow-up calcification RIGHT ureter, history
melanoma

EXAM:
CT ABDOMEN AND PELVIS WITH CONTRAST
TECHNIQUE: Multidetector CT imaging of the abdomen and pelvis was performed
using the standard protocol following bolus administration of
intravenous contrast. Sagittal and coronal MPR images reconstructed
from axial data set.
CONTRAST:  100mL CLONXN-Q44 IOPAMIDOL (CLONXN-Q44) INJECTION 61% IV.
No oral contrast.

[Series 3: abd pelvis 5.00 br40 s3 axial · axial · 0.57mm/px · z∈[+1045,+1480]mm · 9 of 103 slices shown, 11 images]
[im 8/103  soft-tissue]
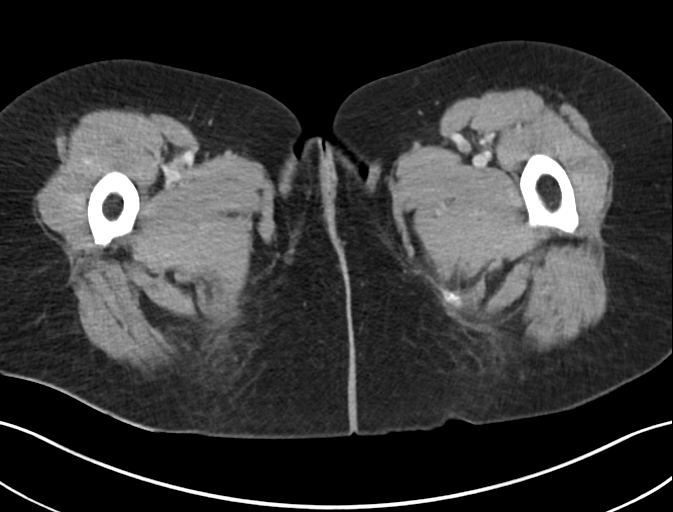
[im 8/103  bone]
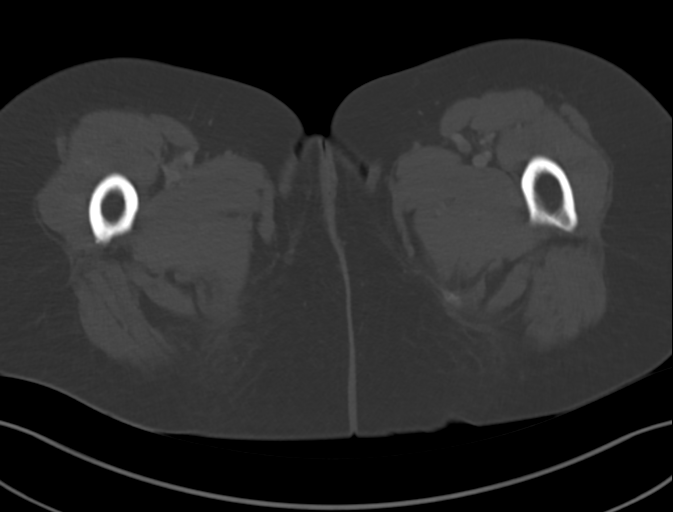
[im 22/103  soft-tissue]
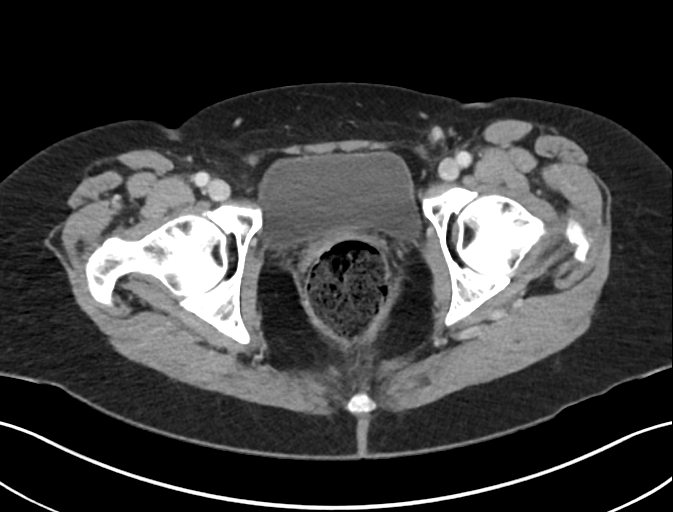
[im 30/103  soft-tissue]
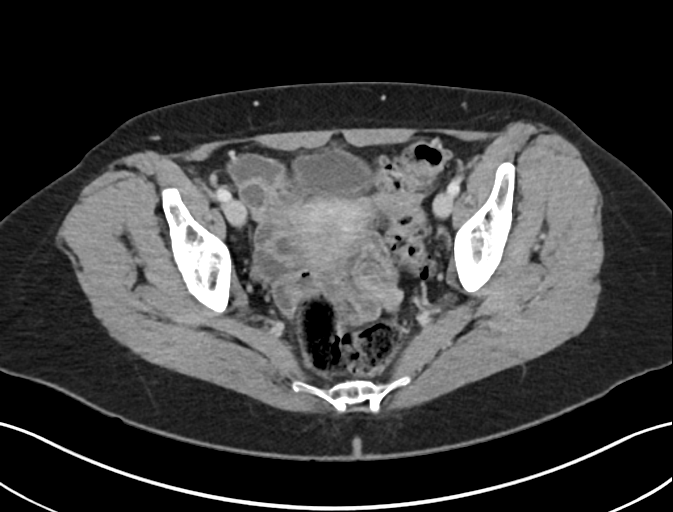
[im 44/103  soft-tissue]
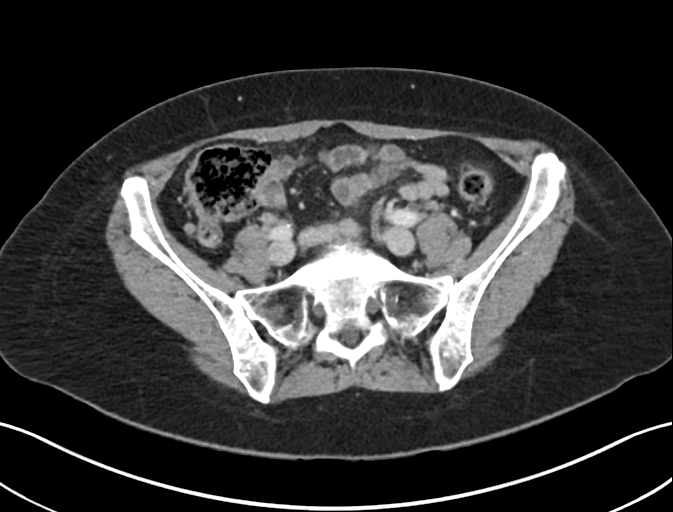
[im 52/103  soft-tissue]
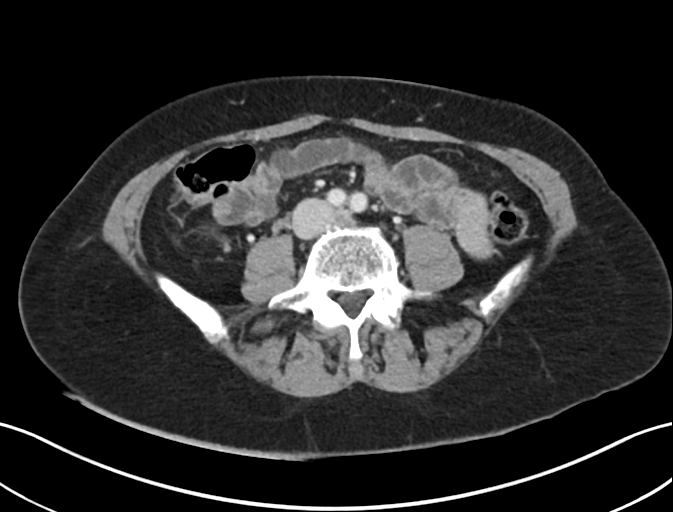
[im 59/103  soft-tissue]
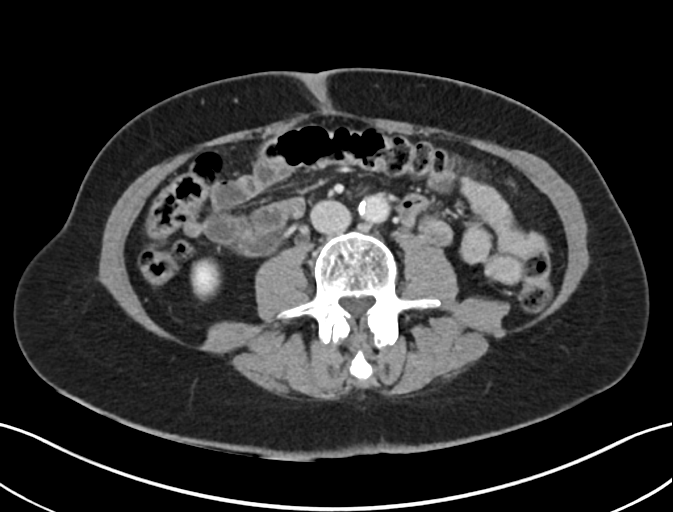
[im 73/103  soft-tissue]
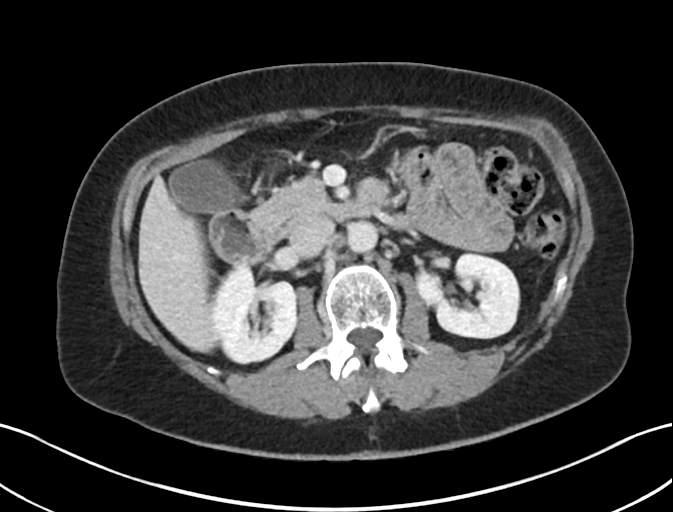
[im 81/103  soft-tissue]
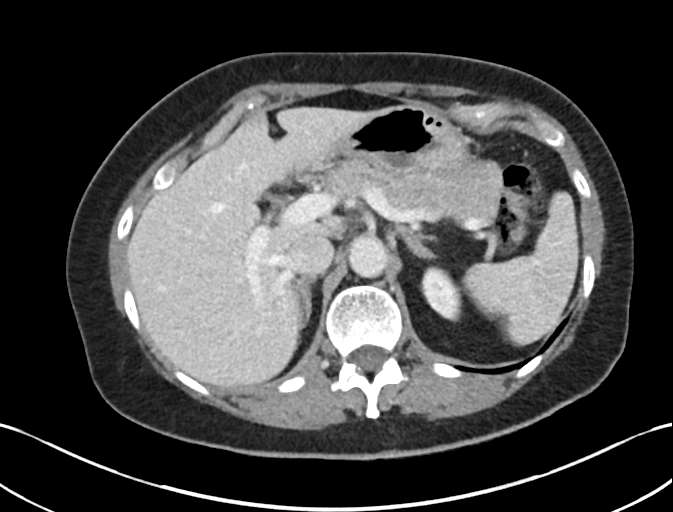
[im 95/103  soft-tissue]
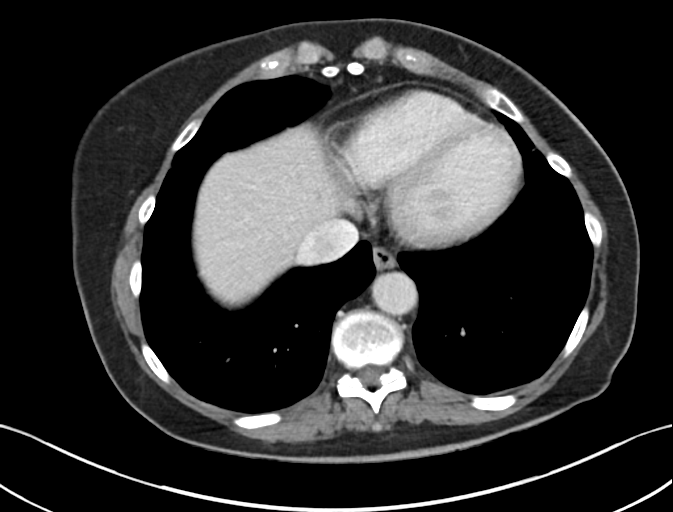
[im 95/103  bone]
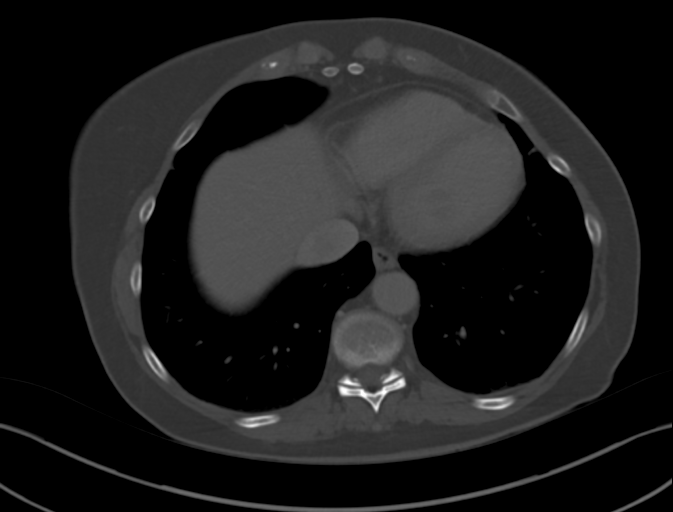

[Series 7: abd pelvis 2.00 br40 s3 cor · coronal · 0.75mm/px · 3 of 142 slices shown]
[im 36/142  soft-tissue]
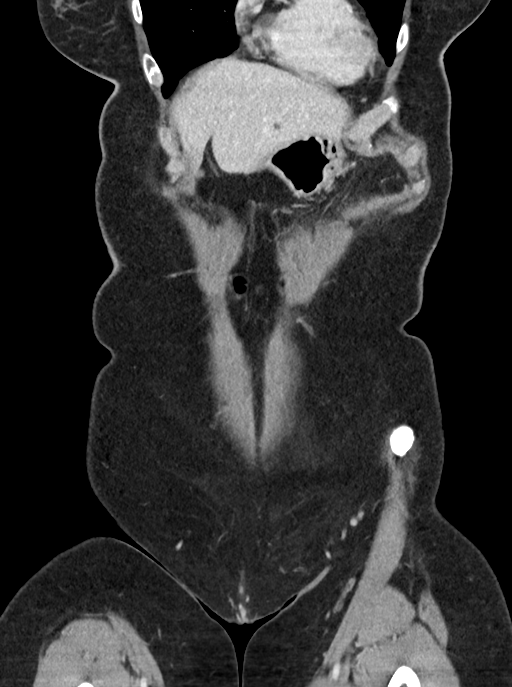
[im 71/142  soft-tissue]
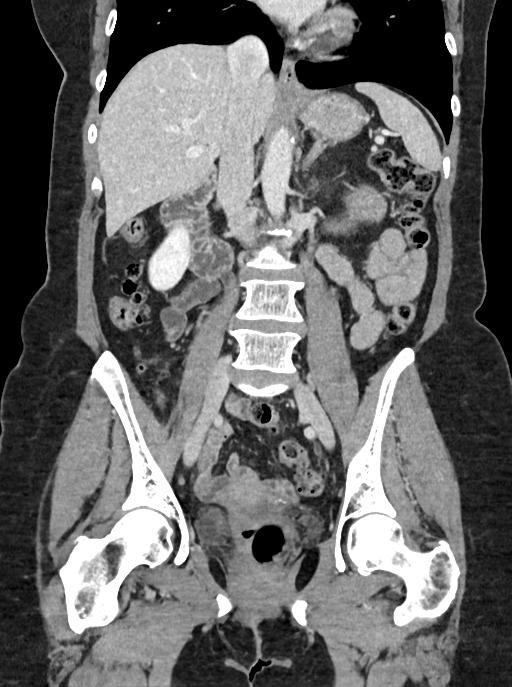
[im 106/142  soft-tissue]
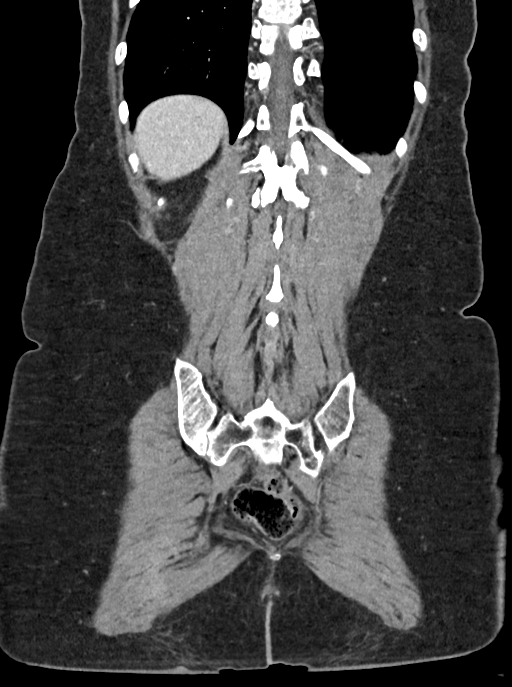

[12 of 46 positions shown; findings below may reference images not displayed]

FINDINGS: Lower chest: Lung bases clear

Hepatobiliary: Scattered tiny low-attenuation foci in liver up to 6
mm question cyst. Gallbladder liver otherwise normal appearance.

Pancreas: Normal appearance

Spleen: Normal appearance. Small splenule adjacent to splenic hilum.

Adrenals/Urinary Tract: Adrenal glands and kidneys normal
appearance. No renal mass or calcification. No hydronephrosis or
hydroureter. No ureteral calcifications. Bladder and ureters
unremarkable. Small vascular calcification at the RIGHT common iliac
artery image 60, which may have accounted for previous CT finding.

Stomach/Bowel: Increased stool in rectum. Normal appendix. Stomach
decompressed. Minimal sigmoid diverticulosis. Bowel loops otherwise
unremarkable.

Vascular/Lymphatic: Atherosclerotic calcifications aorta without
aneurysm. Circumaortic LEFT renal vein. No adenopathy.

Reproductive: Unremarkable uterus and ovaries

Other: No free air or free fluid. No hernia or inflammatory process.

Musculoskeletal: Osseous structures unremarkable.
IMPRESSION: No urinary tract calcification or dilatation.

Tiny hepatic cysts.

No acute intra-abdominal or intrapelvic abnormalities.

## 2020-09-16 ENCOUNTER — Telehealth: Payer: Self-pay | Admitting: Family Medicine

## 2020-09-16 NOTE — Telephone Encounter (Signed)
Left message to request call back from patient regarding new patient application. Application accepted by Janett Billow; ok to schedule. Awaiting call back.
